# Patient Record
Sex: Female | Born: 1995 | Hispanic: Yes | Marital: Single | Smoking: Never smoker
Health system: Southern US, Community
[De-identification: ages and names within clinical notes are randomized; demographics above are authoritative.]

---
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
## 2015-11-02 ENCOUNTER — Other Ambulatory Visit: Payer: Self-pay | Admitting: Family Medicine

## 2015-11-02 DIAGNOSIS — Z3401 Encounter for supervision of normal first pregnancy, first trimester: Secondary | ICD-10-CM

## 2015-11-09 ENCOUNTER — Ambulatory Visit
Admission: RE | Admit: 2015-11-09 | Discharge: 2015-11-09 | Disposition: A | Discharge status: Home / Self Care | Payer: Medicaid Other | Source: Ambulatory Visit | Attending: Family Medicine | Admitting: Family Medicine

## 2015-11-09 ENCOUNTER — Other Ambulatory Visit: Payer: Self-pay | Admitting: Family Medicine

## 2015-11-09 DIAGNOSIS — Z3689 Encounter for other specified antenatal screening: Secondary | ICD-10-CM

## 2015-11-09 DIAGNOSIS — Z3401 Encounter for supervision of normal first pregnancy, first trimester: Secondary | ICD-10-CM

## 2015-11-09 DIAGNOSIS — Z36 Encounter for antenatal screening of mother: Secondary | ICD-10-CM | POA: Insufficient documentation

## 2015-11-09 DIAGNOSIS — Z3A22 22 weeks gestation of pregnancy: Secondary | ICD-10-CM | POA: Diagnosis not present

## 2015-12-29 ENCOUNTER — Emergency Department
Admission: EM | Admit: 2015-12-29 | Discharge: 2015-12-29 | Disposition: A | Discharge status: Home / Self Care | Payer: Medicaid Other | Attending: Emergency Medicine | Admitting: Emergency Medicine

## 2015-12-29 DIAGNOSIS — K219 Gastro-esophageal reflux disease without esophagitis: Secondary | ICD-10-CM | POA: Diagnosis not present

## 2015-12-29 DIAGNOSIS — O9989 Other specified diseases and conditions complicating pregnancy, childbirth and the puerperium: Secondary | ICD-10-CM | POA: Diagnosis present

## 2015-12-29 DIAGNOSIS — O99612 Diseases of the digestive system complicating pregnancy, second trimester: Secondary | ICD-10-CM | POA: Insufficient documentation

## 2015-12-29 DIAGNOSIS — Z3A24 24 weeks gestation of pregnancy: Secondary | ICD-10-CM | POA: Insufficient documentation

## 2015-12-29 MED ORDER — RANITIDINE HCL 150 MG PO TABS: 150.0000 mg | ORAL_TABLET | Freq: Two times a day (BID) | ORAL | Status: AC

## 2015-12-29 MED ORDER — ALUM & MAG HYDROXIDE-SIMETH 200-200-20 MG/5ML PO SUSP
ORAL | Status: AC
  Administered 2015-12-29: 30 mL via ORAL
  Filled 2015-12-29: qty 30

## 2015-12-29 MED ORDER — RANITIDINE HCL 150 MG/10ML PO SYRP
150.0000 mg | ORAL_SOLUTION | Freq: Once | ORAL | Status: AC
  Administered 2015-12-29: 150 mg via ORAL
  Filled 2015-12-29: qty 10

## 2015-12-29 MED ORDER — ALUM & MAG HYDROXIDE-SIMETH 200-200-20 MG/5ML PO SUSP
30.0000 mL | Freq: Once | ORAL | Status: AC
  Administered 2015-12-29: 30 mL via ORAL

## 2015-12-29 NOTE — Discharge Instructions (Signed)
Enfermedad por reflujo gastroesofgico en los adultos (Gastroesophageal Reflux Disease, Adult) Normalmente, los alimentos descienden por el esfago y se depositan en el estmago para su digestin. Sin embargo, cuando una persona tiene enfermedad por reflujo gastroesofgico (ERGE), los alimentos y el cido estomacal regresan al esfago. Cuando esto ocurre, el esfago se irrita y se inflama. Con el tiempo, la ERGE puede provocar la formacin de pequeas perforaciones (lceras) en la mucosa del esfago.  CAUSAS Un problema del msculo que se encuentra entre el esfago y el estmago (esfnter esofgico inferior o EEI) es la causa de esta enfermedad. Por lo general, el esfnter esofgico inferior se cierra despus de que los alimentos pasan a travs del esfago hacia el estmago. Cuando el EEI est debilitado o no es normal, no se cierra correctamente, lo que permite el paso retrgrado de los alimentos y el cido estomacal al esfago. Algunas sustancias de la dieta, algunos medicamentos y ciertas enfermedades pueden debilitar este esfnter, entre ellos:  Consumo de tabaco.  Embarazo.  Hernia de hiato.  Consumo excesivo de alcohol.  Algunos alimentos y ciertas bebidas, como el caf, el chocolate, las cebollas y la menta. FACTORES DE RIESGO Es ms probable que esta afeccin se manifieste en:  Los personas con sobrepeso.  Las personas con trastornos del tejido conjuntivo.  Las personas que toman antiinflamatorios no esteroides (AINE). SNTOMAS Los sntomas de esta afeccin incluyen lo siguiente:  Acidez estomacal.  Dificultad o dolor al tragar.  Sensacin de tener un bulto en la garganta.  Sabor amargo en la boca.  Mal aliento.  Gran cantidad de saliva.  Malestar estomacal o meteorismo.  Flatulencias.  Dolor en el pecho.  Falta de aire o sibilancias.  Tos permanente (crnica) o tos nocturna.  Desgaste el esmalte dental.  Prdida de peso. El dolor en el pecho puede deberse a  muchas afecciones diferentes. Consulte al mdico si tiene dolor en el pecho. DIAGNSTICO El mdico le har una historia clnica y un examen fsico. Para determinar si la ERGE es leve o grave, el mdico tambin puede controlar la respuesta al tratamiento. Tambin pueden hacerle otros estudios, por ejemplo:  Una endoscopia para examinar el estmago y el esfago con una pequea cmara.  Un estudio que determina el nivel de acidez en el esfago.  Un estudio que mide la presin que hay en el esfago.  Un estudio de deglucin de bario o un estudio modificado de deglucin de bario para mostrar la forma, el tamao y el funcionamiento del esfago. TRATAMIENTO El objetivo del tratamiento es aliviar los sntomas y evitar las complicaciones. El tratamiento de esta afeccin puede variar en funcin de la gravedad de los sntomas. El mdico podr indicar lo siguiente:  Cambios en la dieta.  Medicamentos.  Ciruga. INSTRUCCIONES PARA EL CUIDADO EN EL HOGAR Dieta  Siga la dieta que le haya recomendado el mdico, la cual puede incluir evitar alimentos y bebidas tales como:  Caf y t (con o sin cafena).  Bebidas que contengan alcohol.  Bebidas energizantes y deportivas.  Gaseosas o refrescos.  Chocolate y cacao.  Menta y esencias de menta.  Ajo y cebollas.  Rbano picante.  Alimentos muy condimentados y cidos, entre ellos, pimientos, chile en polvo, curry en polvo, vinagre, salsas picantes y salsa barbacoa.  Frutas ctricas y sus jugos, como naranjas, limones y limas.  Alimentos a base de tomates, como salsa roja, chile, salsa y pizza con salsa roja.  Alimentos fritos y grasos, como rosquillas, papas fritas y aderezos con alto   contenido de grasa.  Carnes con alto contenido de grasa, como hot dogs y cortes grasos de carnes rojas y blancas, por ejemplo, filetes de entrecot, salchicha, jamn y tocino.  Productos lcteos con alto contenido de grasa, como leche entera, mantequilla y  queso crema.  Haga comidas pequeas y frecuentes durante el da en lugar de comidas abundantes.  Evite beber mucho lquido con las comidas.  No coma durante las 2 o 3horas previas a la hora de acostarse.  No se acueste inmediatamente despus de comer.  No haga actividad fsica enseguida despus de comer. Instrucciones generales  Est atento a cualquier cambio en los sntomas.  Tome los medicamentos de venta libre y los recetados solamente como se lo haya indicado el mdico. No tome aspirina, ibuprofeno ni otros antiinflamatorios no esteroides (AINE), a menos que se lo haya indicado el mdico.  No consuma ningn producto que contenga tabaco, lo que incluye cigarrillos, tabaco de mascar y cigarrillos electrnicos. Si necesita ayuda para dejar de fumar, consulte al mdico.  Use ropas sueltas. No use prendas ajustadas alrededor de la cintura que ejerzan presin en el abdomen.  Levante (eleve) 6pulgadas (15centmetros) la cabecera de la cama.  Trate de reducir el nivel de estrs con actividades como el yoga o la meditacin. Si necesita ayuda para reducir el nivel de estrs, consulte al mdico.  Si tiene sobrepeso, adelgace hasta alcanzar un peso saludable. Hable con el mdico acerca de su peso ideal y pdale asesoramiento en cuanto a la dieta que debe seguir para poder alcanzarlo.  Concurra a todas las visitas de control como se lo haya indicado el mdico. Esto es importante. SOLICITE ATENCIN MDICA SI:  Aparecen nuevos sntomas.  Baja de peso sin causa aparente.  Tiene dificultad para tragar o siente dolor al hacerlo.  Tiene sibilancias o tos persistente.  Los sntomas no mejoran con el tratamiento.  Tiene la voz ronca. SOLICITE ATENCIN MDICA DE INMEDIATO SI:  Tiene dolor en los brazos, el cuello, los maxilares, la dentadura o la espalda.  Transpira, se marea o tiene sensacin de desvanecimiento.  Siente falta de aire o dolor en el pecho.  Vomita y el vmito es  parecido a la sangre o a los granos de caf.  Se desmaya.  Las heces son sanguinolentas o de color negro.  No puede tragar, beber o comer.   Esta informacin no tiene como fin reemplazar el consejo del mdico. Asegrese de hacerle al mdico cualquier pregunta que tenga.   Document Released: 08/07/2005 Document Revised: 07/19/2015 Elsevier Interactive Patient Education 2016 [employer]   

## 2015-12-29 NOTE — ED Provider Notes (Signed)
[facility] Emergency Department Provider Note  ____________________________________________  Time seen: 3:20 AM  I have reviewed the triage vital signs and the nursing notes.   HISTORY  Chief Complaint No chief complaint on file.      HPI Brandy Collier is a 20 y.o. female currently 6 months pregnant presents with "burning" midline chest pain extending to the neck onset at 5 PM today and reoccurred at 11 PM. Patient denies any dyspnea, dizziness or palpitations. Current pain score is described as mild. Patient denies any aggravating or alleviating factors.    Past medical history Currently 6 months pregnant G1 P0 There are no active problems to display for this patient.   Past surgical history None No current outpatient prescriptions on file.  Allergies Review of patient's allergies indicates not on file.  No family history on file.  Social History Social History  Substance Use Topics  . Smoking status: Not on file  . Smokeless tobacco: Not on file  . Alcohol Use: Not on file    Review of Systems  Constitutional: Negative for fever. Eyes: Negative for visual changes. ENT: Negative for sore throat. Cardiovascular: Positive for burning chest pain. Respiratory: Negative for shortness of breath. Gastrointestinal: Negative for abdominal pain, vomiting and diarrhea. Genitourinary: Negative for dysuria. Musculoskeletal: Negative for back pain. Skin: Negative for rash. Neurological: Negative for headaches, focal weakness or numbness.   10-point ROS otherwise negative.  ____________________________________________   PHYSICAL EXAM:  VITAL SIGNS: ED Triage Vitals  Enc Vitals Group     BP --      Pulse --      Resp --      Temp --      Temp src --      SpO2 --      Weight --      Height --      Head Cir --      Peak Flow --      Pain Score --      Pain Loc --      Pain Edu? --      Excl. in GC? --      Constitutional:  Alert and oriented. Well appearing and in no distress. Eyes: Conjunctivae are normal. PERRL. Normal extraocular movements. ENT   Head: Normocephalic and atraumatic.   Nose: No congestion/rhinnorhea.   Mouth/Throat: Mucous membranes are moist.   Neck: No stridor. Hematological/Lymphatic/Immunilogical: No cervical lymphadenopathy. Cardiovascular: Normal rate, regular rhythm. Normal and symmetric distal pulses are present in all extremities. No murmurs, rubs, or gallops. Respiratory: Normal respiratory effort without tachypnea nor retractions. Breath sounds are clear and equal bilaterally. No wheezes/rales/rhonchi. Gastrointestinal: Soft and nontender. No distention. There is no CVA tenderness. Genitourinary: deferred Musculoskeletal: Nontender with normal range of motion in all extremities. No joint effusions.  No lower extremity tenderness nor edema. Neurologic:  Normal speech and language. No gross focal neurologic deficits are appreciated. Speech is normal.  Skin:  Skin is warm, dry and intact. No rash noted. Psychiatric: Mood and affect are normal. Speech and behavior are normal. Patient exhibits appropriate insight and judgment.   EKG  ED ECG REPORT I, Vitaly Wanat, [place] N, the attending physician, personally viewed and interpreted this ECG.   Date: 12/29/2015  EKG Time: 3:34 AM  Rate: 76  Rhythm: Normal sinus rhythm  Axis: Normal  Intervals: Normal  ST&T Change: None      INITIAL IMPRESSION / ASSESSMENT AND PLAN / ED COURSE  Pertinent labs & imaging results that  were available during my care of the patient were reviewed by me and considered in my medical decision making (see chart for details).  History and physical exam consistent with gastroesophageal reflux disease. Patient received Maalox in the emergency department with complete resolution of symptoms.  ____________________________________________   FINAL CLINICAL IMPRESSION(S) / ED DIAGNOSES  Final  diagnoses:  Gastroesophageal reflux disease, esophagitis presence not specified      Darci Current, MD 12/29/15 0430

## 2015-12-29 NOTE — ED Notes (Signed)
Pt presents via EMS from home with c/o chest burning that started 5 pm last night and got worse around 11 pm when pt states that she laid down to go to bed.   6 months pregnant. Denies SOB, diaphoresis

## 2017-10-12 IMAGING — US US OB COMP +14 WK
1 series · 13 of 28 positions shown · non-contrast
Comparison: none

CLINICAL DATA: Unknown LMP.  Evaluate dating and anatomy.

EXAM:
OBSTETRICAL ULTRASOUND >14 WKS

[Series 1: us ob comp +14 wk · 0.22mm/px · 13 of 79 slices shown]
[im 3/79]
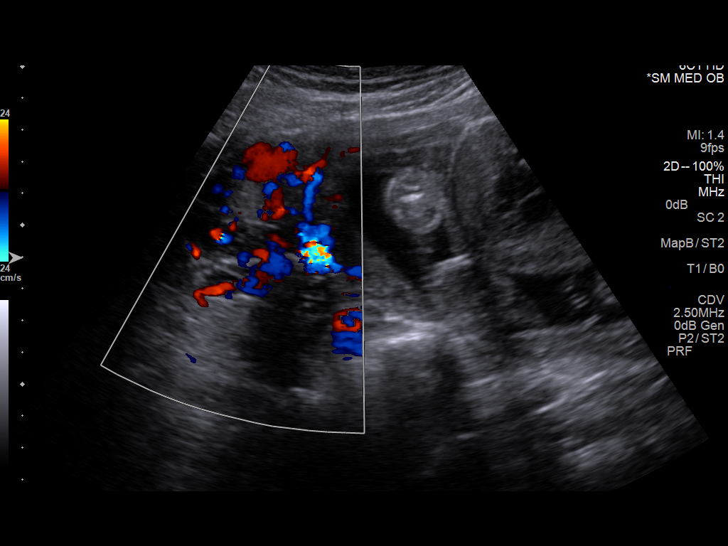
[im 9/79]
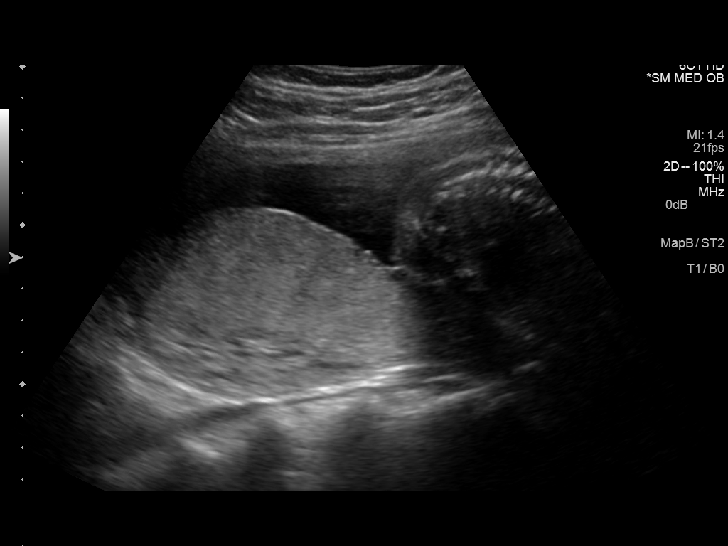
[im 15/79]
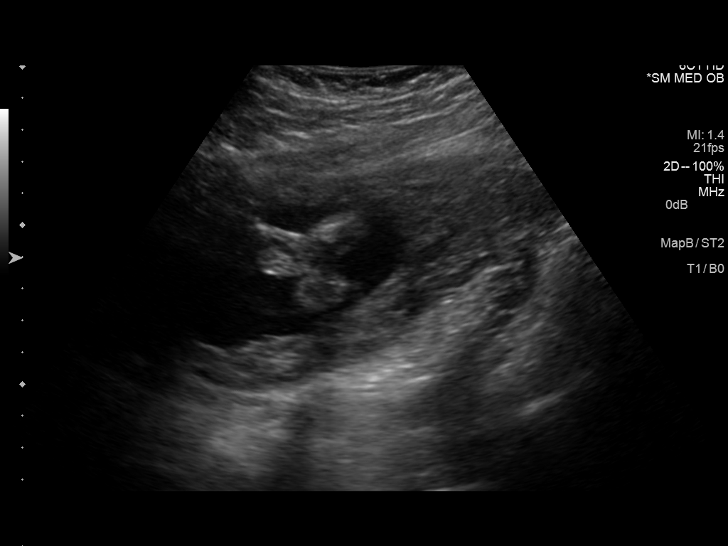
[im 21/79]
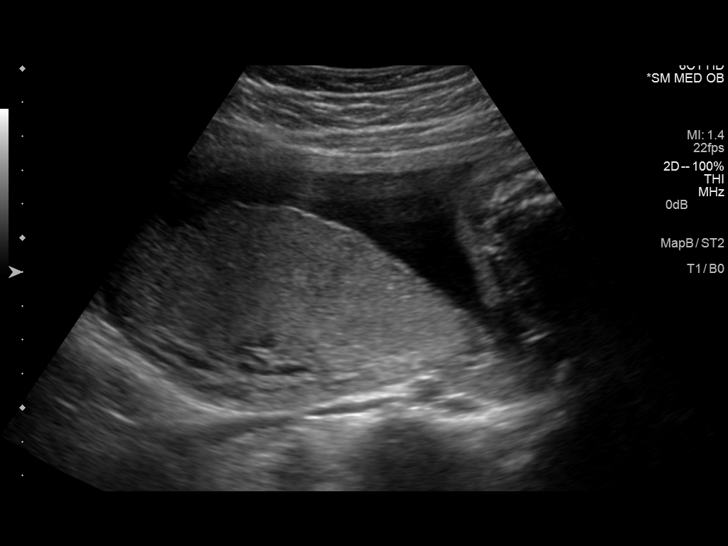
[im 27/79]
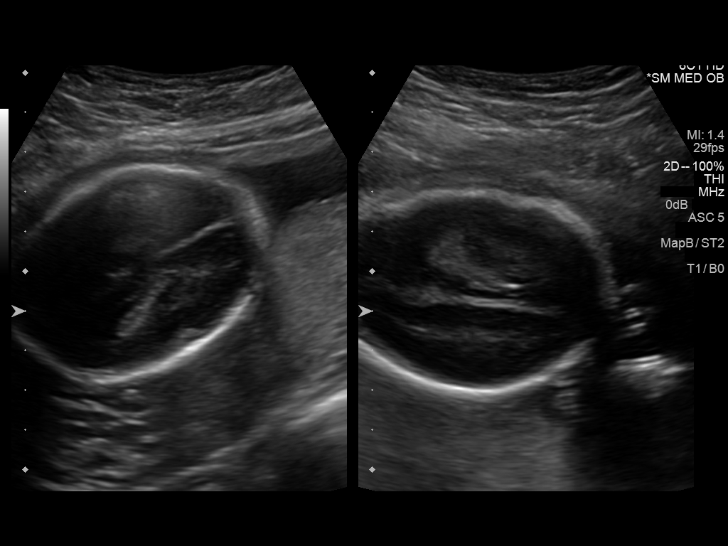
[im 32/79]
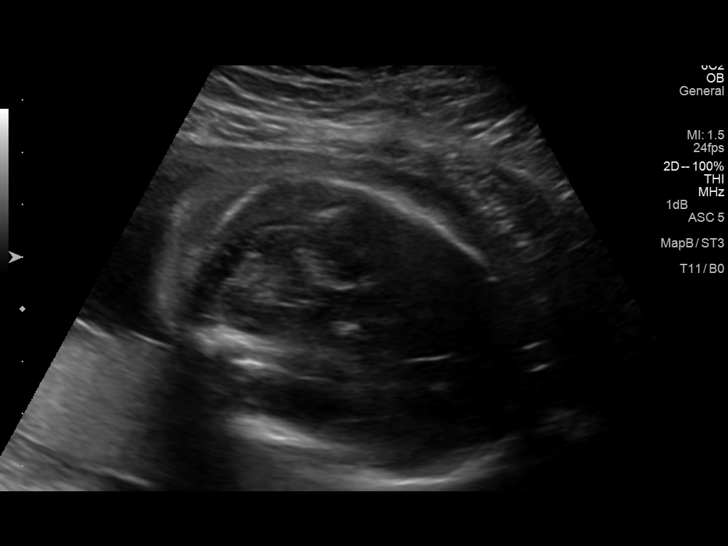
[im 41/79]
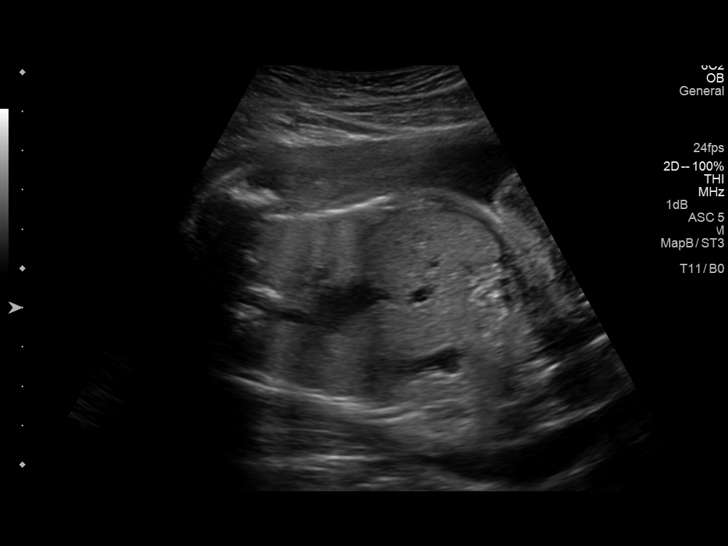
[im 47/79]
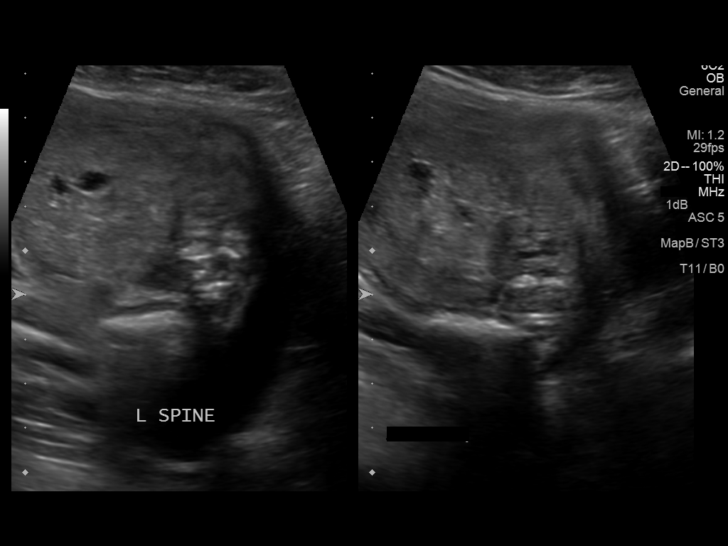
[im 53/79]
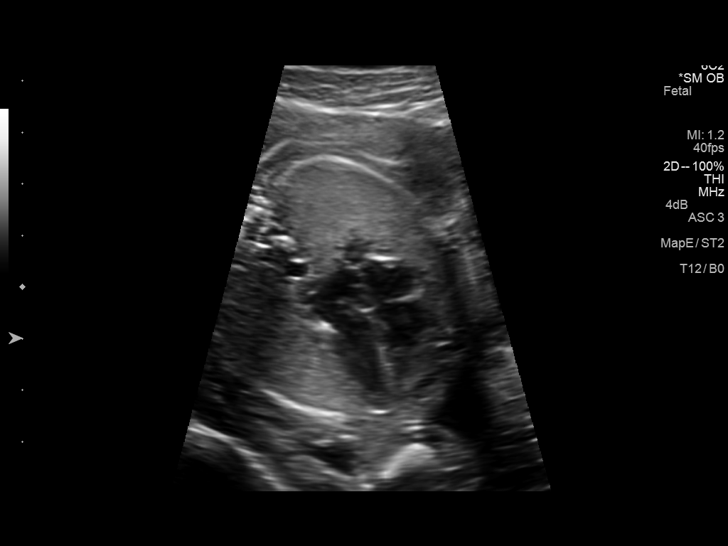
[im 58/79]
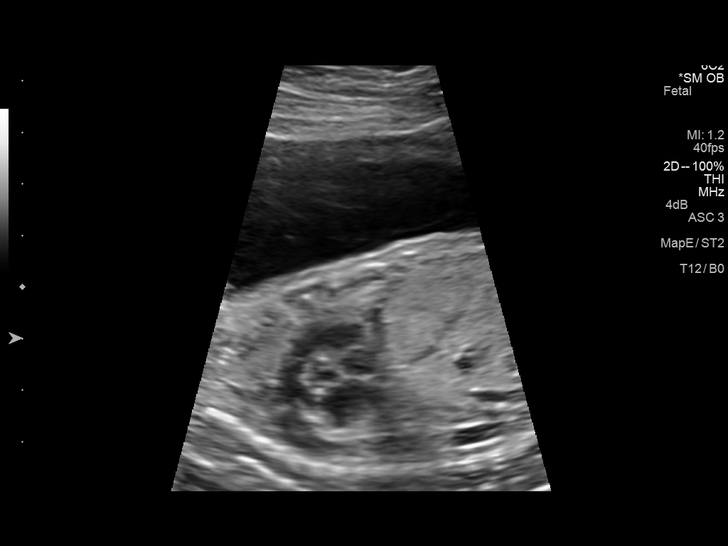
[im 64/79]
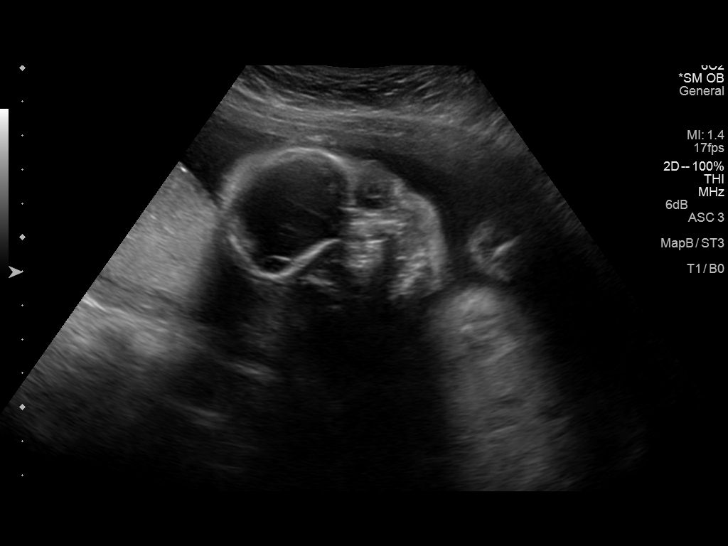
[im 70/79]
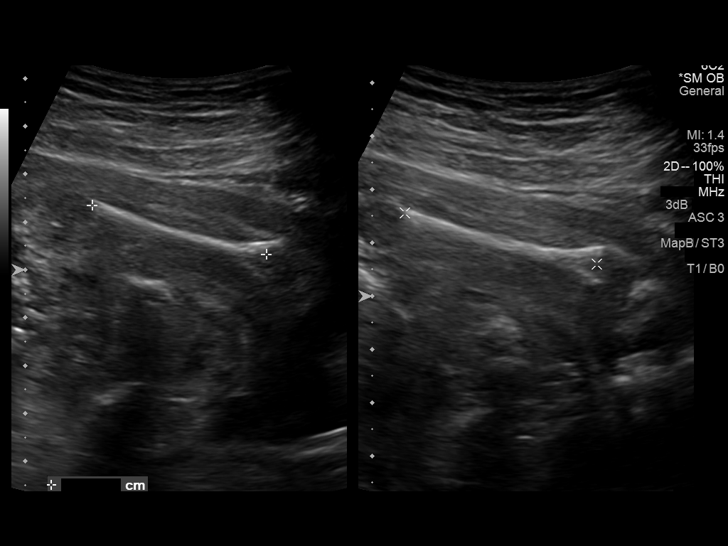
[im 76/79]
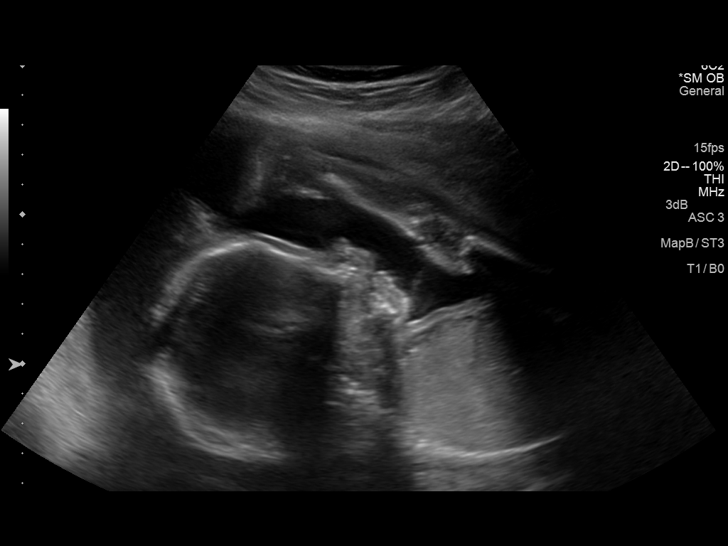

[13 of 28 positions shown; findings below may reference images not displayed]

FINDINGS: Number of Fetuses: 1

Heart Rate:  139 bpm

Movement: Yes

Presentation: Breech

Previa: No

Placental Location: Posterior

Amniotic Fluid (Subjective): Normal

Amniotic Fluid (Objective):

Vertical pocket 3.2cm

FETAL BIOMETRY

BPD:  4.89cm 20w 6d

HC:    19.80cm  22w   0d

AC:   17.91cm  22w   5d

FL:   4.13cm  23w   3d

Current Mean GA: 22w 3d              US EDC: 03-11-16

FETAL ANATOMY

Lateral Ventricles:  Appear normal

Thalami/CSP:  Appear normal

Posterior Fossa:  Appears normal

Nuchal Region:  Appears normal    NFT= 3.3mm

Upper Lip:  Appears normal

Spine:  Appears normal

4 Chamber Heart on Left:  Appears normal

LVOT:  Appears normal

RVOT:  Appears normal

Stomach on Left:  Appears normal

3 Vessel Cord:  Appears normal

Cord Insertion site:  Appears normal

Kidneys:  Appear normal

Bladder:  Appears normal

Extremities:  Appear normal

Sex:  Male genitalia

Maternal Findings:

Cervix:  3.3 cm transabdominal
IMPRESSION: Single living intrauterine fetus measuring 22 weeks 3 days with US
EDC of 03/11/2016.

No fetal anomalies seen involving visualized anatomy noted above.

## 2019-04-29 LAB — HM HIV SCREENING LAB: HM HIV Screening: NEGATIVE

## 2019-05-18 ENCOUNTER — Telehealth: Payer: Self-pay | Admitting: Family Medicine

## 2019-05-18 NOTE — Telephone Encounter (Signed)
Patient wants to know her test results. °

## 2019-05-19 NOTE — Telephone Encounter (Signed)
TC to patient. Verified ID via password. Discussed all negative results. Patient verbalized understanding Aileen Fass, RN

## 2019-08-10 ENCOUNTER — Other Ambulatory Visit: Payer: Self-pay

## 2019-08-10 ENCOUNTER — Encounter: Payer: Self-pay | Admitting: Family Medicine

## 2019-08-10 ENCOUNTER — Ambulatory Visit: Payer: Self-pay | Admitting: Family Medicine

## 2019-08-10 DIAGNOSIS — Z113 Encounter for screening for infections with a predominantly sexual mode of transmission: Secondary | ICD-10-CM

## 2019-08-10 LAB — WET PREP FOR TRICH, YEAST, CLUE
Trichomonas Exam: NEGATIVE
Yeast Exam: NEGATIVE

## 2019-08-10 NOTE — Progress Notes (Signed)
STI clinic/screening visit  Subjective:  Brandy Collier is a 23 y.o. female being seen today for an STI screening visit. The patient reports they do not have symptoms.  Patient has the following medical conditions:  There are no active problems to display for this patient.    Chief Complaint  Patient presents with  . SEXUALLY TRANSMITTED DISEASE    HPI  Patient reports no sx but has positive CT in 2020, unsure if partner was treated. Wants to be tested  See flowsheet for further details and programmatic requirements.    The following portions of the patient's history were reviewed and updated as appropriate: allergies, current medications, past medical history, past social history, past surgical history and problem list.  Objective:  There were no vitals filed for this visit.  Physical Exam Vitals signs and nursing note reviewed.  Constitutional:      Appearance: Normal appearance.  HENT:     Head: Normocephalic and atraumatic.     Mouth/Throat:     Mouth: Mucous membranes are moist.     Pharynx: Oropharynx is clear. No oropharyngeal exudate or posterior oropharyngeal erythema.  Pulmonary:     Effort: Pulmonary effort is normal.  Abdominal:     General: Abdomen is flat.     Palpations: There is no mass.     Tenderness: There is no abdominal tenderness. There is no rebound.  Genitourinary:    General: Normal vulva.     Exam position: Lithotomy position.     Pubic Area: No rash or pubic lice.      Labia:        Right: No rash or lesion.        Left: No rash or lesion.      Vagina: Normal. No vaginal discharge, erythema, bleeding or lesions.     Cervix: No cervical motion tenderness, discharge (white, thin<4.5 ph), friability, lesion or erythema.     Uterus: Normal.      Adnexa: Right adnexa normal and left adnexa normal.     Rectum: Normal.  Lymphadenopathy:     Head:     Right side of head: No preauricular or posterior auricular adenopathy.     Left side of head:  No preauricular or posterior auricular adenopathy.     Cervical: No cervical adenopathy.     Upper Body:     Right upper body: No supraclavicular or axillary adenopathy.     Left upper body: No supraclavicular or axillary adenopathy.     Lower Body: No right inguinal adenopathy. No left inguinal adenopathy.  Skin:    General: Skin is warm and dry.     Findings: No rash.  Neurological:     Mental Status: She is alert and oriented to person, place, and time.       Assessment and Plan:  Brandy Collier is a 23 y.o. female presenting to the [facility] Department for STI screening  1. Screening examination for venereal disease Patient accepted all screenings including vaginal CT/GC. Declines blood work Treat wet prep per standing order Discussed that GC/CT NAAT may take >1 week to result. Patient will be called with positive results and encouraged patient to call if she had not heard in 2 weeks.  Counseled on warning s/sx and when to seek care Recommended condom use with all sex Patient is currently using *Mirena to prevent pregnancy.    - WET PREP FOR [place], YEAST, [facility]     No follow-ups  on file.  No future appointments.  Federico Flake, MD

## 2019-08-10 NOTE — Progress Notes (Signed)
Here today for STD screening. Accepts bloodwork. Hal Morales, RN Wet Prep results reviewed by provider. Per same no treatment indicated. Hal Morales, RN

## 2020-02-02 ENCOUNTER — Emergency Department
Admission: EM | Admit: 2020-02-02 | Discharge: 2020-02-02 | Disposition: A | Discharge status: Home / Self Care | Payer: Medicaid Other | Attending: Emergency Medicine | Admitting: Emergency Medicine

## 2020-02-02 ENCOUNTER — Other Ambulatory Visit: Payer: Self-pay

## 2020-02-02 DIAGNOSIS — R55 Syncope and collapse: Secondary | ICD-10-CM

## 2020-02-02 DIAGNOSIS — Z3A21 21 weeks gestation of pregnancy: Secondary | ICD-10-CM | POA: Insufficient documentation

## 2020-02-02 DIAGNOSIS — R42 Dizziness and giddiness: Secondary | ICD-10-CM | POA: Insufficient documentation

## 2020-02-02 DIAGNOSIS — O26892 Other specified pregnancy related conditions, second trimester: Secondary | ICD-10-CM | POA: Diagnosis present

## 2020-02-02 LAB — CBC
HCT: 32.8 % — ABNORMAL LOW (ref 36.0–46.0)
Hemoglobin: 11.4 g/dL — ABNORMAL LOW (ref 12.0–15.0)
MCH: 31.8 pg (ref 26.0–34.0)
MCHC: 34.8 g/dL (ref 30.0–36.0)
MCV: 91.6 fL (ref 80.0–100.0)
Platelets: 198 10*3/uL (ref 150–400)
RBC: 3.58 MIL/uL — ABNORMAL LOW (ref 3.87–5.11)
RDW: 12.6 % (ref 11.5–15.5)
WBC: 8.1 10*3/uL (ref 4.0–10.5)
nRBC: 0 % (ref 0.0–0.2)

## 2020-02-02 LAB — URINALYSIS, COMPLETE (UACMP) WITH MICROSCOPIC
Bilirubin Urine: NEGATIVE
Glucose, UA: NEGATIVE mg/dL
Hgb urine dipstick: NEGATIVE
Ketones, ur: NEGATIVE mg/dL
Leukocytes,Ua: NEGATIVE
Nitrite: NEGATIVE
Protein, ur: NEGATIVE mg/dL
Specific Gravity, Urine: 1.002 — ABNORMAL LOW (ref 1.005–1.030)
pH: 7 (ref 5.0–8.0)

## 2020-02-02 LAB — BASIC METABOLIC PANEL
Anion gap: 10 (ref 5–15)
BUN: 8 mg/dL (ref 6–20)
CO2: 24 mmol/L (ref 22–32)
Calcium: 8.8 mg/dL — ABNORMAL LOW (ref 8.9–10.3)
Chloride: 103 mmol/L (ref 98–111)
Creatinine, Ser: 0.47 mg/dL (ref 0.44–1.00)
GFR calc Af Amer: >60 mL/min (ref >60)
GFR calc non Af Amer: >60 mL/min (ref >60)
Glucose, Bld: 99 mg/dL (ref 70–99)
Potassium: 3.7 mmol/L (ref 3.5–5.1)
Sodium: 137 mmol/L (ref 135–145)

## 2020-02-02 MED ORDER — SODIUM CHLORIDE 0.9% FLUSH: 3.0000 mL | Freq: Once | INTRAVENOUS | Status: DC

## 2020-02-02 MED ORDER — SODIUM CHLORIDE 0.9 % IV SOLN
1000.0000 mL | Freq: Once | INTRAVENOUS | Status: AC
Start: 2020-02-02 — End: 2020-02-02
  Administered 2020-02-02: 1000 mL via INTRAVENOUS

## 2020-02-02 NOTE — ED Triage Notes (Signed)
Pt states she had a near sycnopal episode while at work this morning, states she is [redacted] weeks pregnant. Pt states she had sudden onset dizziness with feeling like the room was going dark states her coworkers assisted her. States she had donuts and juice this morning. Denies having any issues with morning sickness

## 2020-02-02 NOTE — ED Provider Notes (Signed)
[facility] Emergency Department Provider Note   ____________________________________________    I have reviewed the triage vital signs and the nursing notes.   HISTORY  Chief Complaint Near Syncope     HPI Brandy Collier is a 24 y.o. female who reports she is [redacted] weeks pregnant who presents after a near syncopal event.  Patient reports she was at work and had been standing for a long period of time and smelled something strong that was sprayed.  Shortly thereafter she began to feel lightheaded and began to develop tunnel vision.  Her coworkers helped her sit down and gave her water which helped her feel better.  Currently she is feeling much better.  No palpitations or chest pain.  No neuro deficits.  Did eat breakfast this morning.  This is not happened before.  History reviewed. No pertinent past medical history.  There are no problems to display for this patient.   History reviewed. No pertinent surgical history.  Prior to Admission medications   Medication Sig Start Date End Date Taking? Authorizing Provider  ranitidine (ZANTAC) 150 MG tablet Take 1 tablet (150 mg total) by mouth 2 (two) times daily. 12/29/15 12/28/16  Gregor Hams, MD     Allergies Patient has no known allergies.  No family history on file.  Social History Social History   Tobacco Use  . Smoking status: Never Smoker  . Smokeless tobacco: Never Used  Substance Use Topics  . Alcohol use: No  . Drug use: Not on file    Review of Systems  Constitutional: No fever/chills Eyes: No visual changes.  ENT: No sore throat. Cardiovascular: Denies chest pain. Respiratory: Denies shortness of breath. Gastrointestinal: No abdominal pain.  No nausea, no vomiting.   Genitourinary: Negative for dysuria. Musculoskeletal: Negative for back pain. Skin: Negative for rash. Neurological: Negative for headaches    ____________________________________________   PHYSICAL  EXAM:  VITAL SIGNS: ED Triage Vitals  Enc Vitals Group     BP 02/02/20 1206 (!) 114/49     Pulse Rate 02/02/20 1206 75     Resp 02/02/20 1206 18     Temp 02/02/20 1206 98.8 F (37.1 C)     Temp Source 02/02/20 1206 Oral     SpO2 02/02/20 1206 100 %     Weight 02/02/20 1149 59 kg (130 lb)     Height 02/02/20 1149 1.549 m (5\' 1" )     Head Circumference --      Peak Flow --      Pain Score 02/02/20 1149 0     Pain Loc --      Pain Edu? --      Excl. in [place]? --     Constitutional: Alert and oriented.   Nose: No congestion/rhinnorhea. Mouth/Throat: Mucous membranes are moist.    Cardiovascular: Normal rate, regular rhythm. Grossly normal heart sounds.  Good peripheral circulation. Respiratory: Normal respiratory effort.  No retractions. Lungs CTAB. Gastrointestinal: Soft and nontender.  Gravid appearance   No CVA tenderness.  Musculoskeletal: No lower extremity tenderness nor edema.  Warm and well perfused Neurologic:  Normal speech and language. No gross focal neurologic deficits are appreciated.  Skin:  Skin is warm, dry and intact. No rash noted. Psychiatric: Mood and affect are normal. Speech and behavior are normal.  ____________________________________________   LABS (all labs ordered are listed, but only abnormal results are displayed)  Labs Reviewed  BASIC METABOLIC PANEL - Abnormal; Notable for the following components:  Result Value   Calcium 8.8 (*)    All other components within normal limits  CBC - Abnormal; Notable for the following components:   RBC 3.58 (*)    Hemoglobin 11.4 (*)    HCT 32.8 (*)    All other components within normal limits  URINALYSIS, COMPLETE (UACMP) WITH MICROSCOPIC - Abnormal; Notable for the following components:   Color, Urine STRAW (*)    APPearance CLEAR (*)    Specific Gravity, Urine 1.002 (*)    Bacteria, UA RARE (*)    All other components within normal limits  CBG MONITORING, ED    ____________________________________________  EKG  ED ECG REPORT I, Jene Every, the attending physician, personally viewed and interpreted this ECG.  Date: 02/02/2020  Rhythm: normal sinus rhythm QRS Axis: normal Intervals: normal ST/T Wave abnormalities: normal Narrative Interpretation: no evidence of acute ischemia  ____________________________________________  RADIOLOGY  None ____________________________________________   PROCEDURES  Procedure(s) performed: No  Procedures   Critical Care performed: No ____________________________________________   INITIAL IMPRESSION / ASSESSMENT AND PLAN / ED COURSE  Pertinent labs & imaging results that were available during my care of the patient were reviewed by me and considered in my medical decision making (see chart for details).  Patient well-appearing and in no acute distress, vital signs and exam are quite reassuring here.  EKG is normal.  Patient with near syncope, likely pregnancy related/vasovagal.  Reassuring lab work.  Will give IV fluids and reevaluate  Patient feeling much better after IV fluids, appropriate for discharge at this time, follow-up with OB as needed.  Recommend increased hydration     ____________________________________________   FINAL CLINICAL IMPRESSION(S) / ED DIAGNOSES  Final diagnoses:  Near syncope        Note:  This document was prepared using Dragon voice recognition software and may include unintentional dictation errors.   Jene Every, MD 02/02/20 [phone]

## 2022-11-29 ENCOUNTER — Ambulatory Visit: Payer: Medicaid Other

## 2022-12-06 ENCOUNTER — Ambulatory Visit (LOCAL_COMMUNITY_HEALTH_CENTER): Payer: Medicaid Other

## 2022-12-06 DIAGNOSIS — Z23 Encounter for immunization: Secondary | ICD-10-CM | POA: Diagnosis not present

## 2022-12-06 DIAGNOSIS — Z719 Counseling, unspecified: Secondary | ICD-10-CM

## 2022-12-06 NOTE — Progress Notes (Signed)
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  Release of information signed by client to received copy of historical records stored in Medical Records at [facility].  After reviewing historical vaccine records. Varicella offered today . VIS provided.  Copy of historical vaccine record provided.  Vaccine administered and tolerated well.  After vaccine care reviewed.   Copy of [facility] provided.  [name] Shelda Pal, RN

## 2024-03-07 ENCOUNTER — Other Ambulatory Visit: Payer: Self-pay

## 2024-03-07 DIAGNOSIS — R197 Diarrhea, unspecified: Secondary | ICD-10-CM | POA: Diagnosis present

## 2024-03-07 DIAGNOSIS — R1084 Generalized abdominal pain: Secondary | ICD-10-CM | POA: Insufficient documentation

## 2024-03-07 NOTE — ED Triage Notes (Addendum)
 Pt c/o constipation x 1 day. Pt reports having some pain and blood when having pain BM. Denies n/v/d and fevers.

## 2024-03-08 ENCOUNTER — Emergency Department
Admission: EM | Admit: 2024-03-08 | Discharge: 2024-03-08 | Disposition: A | Discharge status: Home / Self Care | Attending: Emergency Medicine | Admitting: Emergency Medicine

## 2024-03-08 ENCOUNTER — Emergency Department

## 2024-03-08 DIAGNOSIS — R197 Diarrhea, unspecified: Secondary | ICD-10-CM

## 2024-03-08 LAB — POC URINE PREG, ED: Preg Test, Ur: NEGATIVE

## 2024-03-08 MED ORDER — CIPROFLOXACIN HCL 500 MG PO TABS
500.0000 mg | ORAL_TABLET | Freq: Once | ORAL | Status: AC
  Administered 2024-03-08: 500 mg via ORAL
  Filled 2024-03-08: qty 1

## 2024-03-08 MED ORDER — CIPROFLOXACIN HCL 500 MG PO TABS: 500.0000 mg | ORAL_TABLET | Freq: Two times a day (BID) | ORAL | 0 refills | Status: AC

## 2024-03-08 NOTE — Discharge Instructions (Addendum)
 Fortunately your CT scan did not show any problems inside of your abdomen that would require an emergency surgery or hospitalization at this time.    Take antibiotics as prescribed for 3 days.  Take acetaminophen 650 mg and ibuprofen 400 mg every 6 hours for pain.  Take with food.  Thank you for choosing us  for your health care today!  Please see your primary doctor this week for a follow up appointment.   If you have any new, worsening, or unexpected symptoms call your doctor right away or come back to the emergency department for reevaluation.  It was my pleasure to care for you today.   Arron Large Margery Sheets, MD

## 2024-03-08 NOTE — ED Provider Notes (Signed)
 [facility] Provider Note    Event Date/Time   First MD Initiated Contact with Patient 03/08/24 0215     (approximate)   History   Constipation   HPI  Brandy Collier is a 28 y.o. female   Past medical history of no significant past medical history presents to the Emergency Department with loose watery stools with some blood clots mixed in.  This started yesterday.  She has crampy abdominal pain diffusely as well.  No nausea or vomiting.  No urinary symptoms.  No fevers or chills.  Denies recent antibiotic use or foreign travel.  No recent hospitalizations.    External Medical Documents Reviewed: Prior obstetrics note from 2020      Physical Exam   Triage Vital Signs: ED Triage Vitals  Encounter Vitals Group     BP 03/07/24 2344 122/84     Systolic BP Percentile --      Diastolic BP Percentile --      Pulse Rate 03/07/24 2344 93     Resp 03/07/24 2346 20     Temp 03/07/24 2344 98.6 F (37 C)     Temp Source 03/07/24 2344 Oral     SpO2 03/07/24 2344 99 %     Weight 03/07/24 2345 135 lb (61.2 kg)     Height 03/07/24 2345 5\' 1"  (1.549 m)     Head Circumference --      Peak Flow --      Pain Score 03/07/24 2343 0     Pain Loc --      Pain Education --      Exclude from Growth Chart --     Most recent vital signs: Vitals:   03/07/24 2344 03/07/24 2346  BP: 122/84   Pulse: 93   Resp:  20  Temp: 98.6 F (37 C)   SpO2: 99%     General: Awake, no distress.  CV:  Good peripheral perfusion.  Resp:  Normal effort.  Abd:  No distention.  Other:  She has mild diffuse lower quadrant tenderness to palpation without rigidity or guarding.   ED Results / Procedures / Treatments   Labs (all labs ordered are listed, but only abnormal results are displayed) Labs Reviewed  POC URINE PREG, ED     I ordered and reviewed the above labs they are notable for negative pregnancy test   RADIOLOGY I independently reviewed and interpreted CT of  the abdomen see no obstructive or inflammatory changes I also reviewed radiologist's formal read.   PROCEDURES:  Critical Care performed: No  Procedures   MEDICATIONS ORDERED IN ED: Medications  ciprofloxacin (CIPRO) tablet 500 mg (has no administration in time range)    IMPRESSION / MDM / ASSESSMENT AND PLAN / ED COURSE  I reviewed the triage vital signs and the nursing notes.                                Patient's presentation is most consistent with acute presentation with potential threat to life or bodily function.  Differential diagnosis includes, but is not limited to, enteritis/colitis, infectious diarrhea, diverticulitis, appendicitis   The patient is on the cardiac monitor to evaluate for evidence of arrhythmia and/or significant heart rate changes.  MDM:    Diarrhea with some blood, no fever, and diffuse mild tenderness to palpation.  Most likely represents gastroenteritis/colitis or infectious diarrhea and would opt for antibiotic treatment given the  bloody diarrhea.  Initial plan was for outpatient treatment with infectious diarrhea medications.  I described to the patient my initial evaluation and low clinical suspicion for surgical abdominal pathologies like appendicitis, given the crampy diffuse nature of her pain, after which she tells me that in fact the right lower quadrant is where seems to be bothering her now.  When I suggested CT scan for further evaluation for appendicitis given the location of her most recent pain, she was hesitant given that she needs to give her mother's car back so that she can get to work in a few hours.  She was hesitant to stay for lab analysis and CT scan and had initially opted to go home with diarrheal treatment and home observation.  I thought this was a reasonable alternative approach as well, but then upon second thought she decided she would like to stay for CT scan.  Given her time restrictions, we will check a pregnancy  test, defer lab analysis instead proceed with CT scan if there are no surgical problems noted on scan plan will be for discharge on antibiotics as above.  CT negative patient stable discharge on antibiotics for infectious diarrhea.     FINAL CLINICAL IMPRESSION(S) / ED DIAGNOSES   Final diagnoses:  Diarrhea of presumed infectious origin     Rx / DC Orders   ED Discharge Orders          Ordered    ciprofloxacin (CIPRO) 500 MG tablet  2 times daily        03/08/24 0310             Note:  This document was prepared using Dragon voice recognition software and may include unintentional dictation errors.    Buell Carmin, MD 03/08/24 [phone]

## 2024-03-08 NOTE — ED Notes (Signed)
 Pt ambulatory to CT with CT tech. NADN

## 2024-04-07 ENCOUNTER — Other Ambulatory Visit: Payer: Self-pay

## 2024-04-07 DIAGNOSIS — R7401 Elevation of levels of liver transaminase levels: Secondary | ICD-10-CM

## 2024-04-09 ENCOUNTER — Ambulatory Visit
